# Patient Record
Sex: Male | Born: 1990 | Race: White | Hispanic: No | Marital: Married | Smoking: Current every day smoker
Health system: Southern US, Community
[De-identification: ages and names within clinical notes are randomized; demographics above are authoritative.]

## PROBLEM LIST (undated history)

## (undated) DIAGNOSIS — J45909 Unspecified asthma, uncomplicated: Secondary | ICD-10-CM

## (undated) DIAGNOSIS — F419 Anxiety disorder, unspecified: Secondary | ICD-10-CM

## (undated) DIAGNOSIS — F32A Depression, unspecified: Secondary | ICD-10-CM

## (undated) HISTORY — DX: Depression, unspecified: F32.A

## (undated) HISTORY — DX: Anxiety disorder, unspecified: F41.9

---
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
## 2011-09-15 ENCOUNTER — Emergency Department: Payer: Self-pay | Admitting: Emergency Medicine

## 2012-02-03 ENCOUNTER — Emergency Department: Payer: Self-pay | Admitting: Unknown Physician Specialty

## 2012-02-03 LAB — CBC
HCT: 40.4 % (ref 40.0–52.0)
HGB: 13.8 g/dL (ref 13.0–18.0)
MCH: 32.2 pg (ref 26.0–34.0)
MCHC: 34.2 g/dL (ref 32.0–36.0)
RBC: 4.29 10*6/uL — ABNORMAL LOW (ref 4.40–5.90)
WBC: 9.5 10*3/uL (ref 3.8–10.6)

## 2012-02-03 LAB — COMPREHENSIVE METABOLIC PANEL
Albumin: 4.6 g/dL (ref 3.4–5.0)
Alkaline Phosphatase: 75 U/L (ref 50–136)
Anion Gap: 7 (ref 7–16)
Bilirubin,Total: 0.3 mg/dL (ref 0.2–1.0)
Calcium, Total: 9.6 mg/dL (ref 8.5–10.1)
Co2: 29 mmol/L (ref 21–32)
Creatinine: 1.13 mg/dL (ref 0.60–1.30)
EGFR (African American): >60
EGFR (Non-African Amer.): >60
Glucose: 98 mg/dL (ref 65–99)
Osmolality: 278 (ref 275–301)
Sodium: 140 mmol/L (ref 136–145)
Total Protein: 8 g/dL (ref 6.4–8.2)

## 2012-02-03 LAB — URINALYSIS, COMPLETE
Bilirubin,UR: NEGATIVE
Blood: NEGATIVE
Glucose,UR: NEGATIVE mg/dL (ref 0–75)
Ketone: NEGATIVE
Ph: 6 (ref 4.5–8.0)
RBC,UR: <1 /HPF (ref 0–5)
WBC UR: NONE SEEN /HPF (ref 0–5)

## 2012-02-03 LAB — LIPASE, BLOOD: Lipase: 147 U/L (ref 73–393)

## 2012-03-27 LAB — DRUG SCREEN, URINE
Barbiturates, Ur Screen: NEGATIVE (ref ?–200)
Benzodiazepine, Ur Scrn: NEGATIVE (ref ?–200)
Cocaine Metabolite,Ur ~~LOC~~: POSITIVE (ref ?–300)
Methadone, Ur Screen: NEGATIVE (ref ?–300)
Opiate, Ur Screen: NEGATIVE (ref ?–300)
Phencyclidine (PCP) Ur S: NEGATIVE (ref ?–25)
Tricyclic, Ur Screen: NEGATIVE (ref ?–1000)

## 2012-03-27 LAB — COMPREHENSIVE METABOLIC PANEL
Alkaline Phosphatase: 91 U/L (ref 50–136)
Bilirubin,Total: 0.5 mg/dL (ref 0.2–1.0)
Calcium, Total: 9.5 mg/dL (ref 8.5–10.1)
Chloride: 103 mmol/L (ref 98–107)
Co2: 24 mmol/L (ref 21–32)
EGFR (African American): >60
EGFR (Non-African Amer.): 60 — ABNORMAL LOW
Osmolality: 278 (ref 275–301)
SGPT (ALT): 23 U/L
Sodium: 138 mmol/L (ref 136–145)

## 2012-03-27 LAB — CBC
HCT: 42.5 % (ref 40.0–52.0)
HGB: 14.2 g/dL (ref 13.0–18.0)
Platelet: 253 10*3/uL (ref 150–440)
RDW: 13.3 % (ref 11.5–14.5)
WBC: 12.9 10*3/uL — ABNORMAL HIGH (ref 3.8–10.6)

## 2012-03-27 LAB — URINALYSIS, COMPLETE
Bacteria: NONE SEEN
Bilirubin,UR: NEGATIVE
Protein: NEGATIVE
RBC,UR: <1 /HPF (ref 0–5)
Squamous Epithelial: <1
WBC UR: <1 /HPF (ref 0–5)

## 2012-03-27 LAB — ETHANOL: Ethanol: <3 mg/dL

## 2012-03-27 LAB — TSH: Thyroid Stimulating Horm: 0.68 u[IU]/mL

## 2012-03-28 ENCOUNTER — Inpatient Hospital Stay: Payer: Self-pay | Admitting: Psychiatry

## 2012-06-23 ENCOUNTER — Emergency Department: Payer: Self-pay | Admitting: Unknown Physician Specialty

## 2012-06-23 LAB — BASIC METABOLIC PANEL
Anion Gap: 10 (ref 7–16)
BUN: 18 mg/dL (ref 7–18)
Calcium, Total: 9.2 mg/dL (ref 8.5–10.1)
Co2: 24 mmol/L (ref 21–32)
EGFR (African American): >60
EGFR (Non-African Amer.): >60
Glucose: 85 mg/dL (ref 65–99)
Osmolality: 279 (ref 275–301)
Potassium: 3.7 mmol/L (ref 3.5–5.1)

## 2012-06-23 LAB — CBC WITH DIFFERENTIAL/PLATELET
Basophil #: 0.1 10*3/uL (ref 0.0–0.1)
Basophil %: 0.9 %
Eosinophil #: 0.1 10*3/uL (ref 0.0–0.7)
Eosinophil %: 1 %
HGB: 14 g/dL (ref 13.0–18.0)
Lymphocyte %: 11.8 %
MCH: 31.8 pg (ref 26.0–34.0)
Monocyte %: 12.6 %
Neutrophil %: 73.7 %
RBC: 4.41 10*6/uL (ref 4.40–5.90)
WBC: 13.5 10*3/uL — ABNORMAL HIGH (ref 3.8–10.6)

## 2013-06-05 ENCOUNTER — Emergency Department: Payer: Self-pay | Admitting: Internal Medicine

## 2014-02-16 ENCOUNTER — Emergency Department: Payer: Self-pay | Admitting: Emergency Medicine

## 2015-01-12 NOTE — H&P (Signed)
PATIENT NAME:  Joshua Owen, Joshua Owen MR#:  [id number] DATE OF BIRTH:  Apr 30, 1991  DATE OF ADMISSION:  03/28/2012  REFERRING PHYSICIAN: Daryel November, MD   ATTENDING PHYSICIAN: Kristine Linea, MD   IDENTIFYING DATA: Joshua Owen is a 24 year old male with history of depression.   CHIEF COMPLAINT: " I feel better now. "   HISTORY OF PRESENT ILLNESS: Joshua Owen lost his brother in a motorcycle accident three years ago. He became depressed and was briefly admitted to [facility]. He was started on Prozac. He did not like the way it made him feel.  He continued medication for a month but felt no improvement. He recovered and was doing okay until recently when he started experiencing some difficulties in his relationship with his baby's mama who is [redacted] weeks pregnant. This got even worse when the patient has learned that his best friend died of kidney failure three days ago. He started arguing with the girlfriend and she left him. He is suspicious that she is seeing someone else, but since he was admitted to the hospital he had an opportunity to talk to her and he feels that they will get back together. He felt okay on the day of admission while at work but when he came home he started worrying about his situation and decided to come to the hospital. He felt mildly suicidal without intention or a plan. He complains of extremely poor sleep for the past eight months, slightly decreased appetite, anhedonia, and irritability. He denies feeling of guilt, hopelessness, worthlessness, no crying, and no decreased energy, memory or concentration. He denies symptoms suggestive of bipolar mania. There are no psychotic symptoms. He denies alcohol, illicit drugs, or prescription pill abuse.   PAST PSYCHIATRIC HISTORY: As above one prior hospitalization following his brother's death. He does not have a psychiatrist at the present and is not in therapy.   FAMILY PSYCHIATRIC HISTORY: Mother with depression and  anxiety, on Xanax and Klonopin. A brother with bipolar who has not been taking his Seroquel.  PAST MEDICAL HISTORY: None.   ALLERGIES: No known drug allergies.   ADMISSION MEDICATIONS: None.   SOCIAL HISTORY: He is employed by a [employer]. 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 He is getting ready to have a baby. This is his first child. He is determined to repair his relationship with the girlfriend. She has depression herself and during pregnancy has not been taking her medications which affects their relationship.   REVIEW OF SYSTEMS: CONSTITUTIONAL: No fevers or chills. There are some weight changes. The patient can gain a few pounds and then lose them. He believes he has high metabolism. EYES: No double or blurred vision. ENT: No hearing loss. RESPIRATORY: No shortness of breath or cough. CARDIOVASCULAR: No chest pain or orthopnea. GASTROINTESTINAL: No abdominal pain, nausea, vomiting, or diarrhea. GU: No incontinence or frequency. ENDOCRINE: No heat or cold intolerance. LYMPHATIC: No anemia or easy bruising. INTEGUMENTARY: No acne or rash. MUSCULOSKELETAL: No muscle or joint pain. NEUROLOGIC: No tingling or weakness. PSYCHIATRIC: See history of present illness for details.   PHYSICAL EXAMINATION:   VITAL SIGNS: Blood pressure 109/72, pulse 55, respirations 20, and temperature 97.6.   GENERAL: This is a slender male in no acute distress.   HEENT: The pupils are equal, round, and reactive to light. Sclerae anicteric.   NECK: Supple. No thyromegaly.   LUNGS: Clear to auscultation. No dullness to percussion.   HEART: Regular rhythm and rate. No murmurs,  rubs, or gallops.   ABDOMEN: Soft, nontender, and nondistended. Positive bowel sounds.   MUSCULOSKELETAL: Normal muscle strength in all extremities.   SKIN: No rashes or bruises.   LYMPHATIC: No cervical adenopathy.   NEUROLOGIC: Cranial nerves II through XII are intact. Normal gait.   LABORATORY DATA: Chemistries  are within normal limits. Creatinine 1.62. Blood alcohol level is zero. LFTs are within normal limits. TSH 0.68. Urine drug screen is positive for cocaine and cannabinoids. CBC is within normal limits. White blood count 12.9.   Urinalysis is not suggestive of urinary tract infection.   MENTAL STATUS EXAMINATION ON ADMISSION: The patient is alert and oriented to person, place, time, and situation. He is pleasant, polite, and cooperative. He is wearing a baseball hat and hospital scrubs. He maintains good eye contact. His speech is of normal rhythm, rate, and volume. Mood is okay with full affect. Thought processing is logical and goal oriented. Thought content - he denies suicidal or homicidal ideation. There are no delusions or paranoia. There are no auditory or visual hallucinations. His cognition is grossly intact. He registers three out of three and recalls three out of three objects after five minutes. He can spell world forward and backward. He knows the current president. His insight and judgment are fair.   SUICIDE RISK ASSESSMENT ON ADMISSION: [name] with a history of depression who has not been treated who became depressed and passively suicidal in the context of major loss and relationship problems.   DIAGNOSES:  AXIS I:  1. Major depressive disorder, recurrent, moderate. 2. Cocaine abuse.  3. Marijuana abuse.   AXIS II: Deferred.   AXIS III: None.   AXIS IV: Mental illness, major loss, substance abuse, relationship, and access to care.   AXIS V: GAF on admission 40.   PLAN: The patient was admitted to [facility] unit for safety, stabilization, and medication management. He was initially placed on suicide precautions and was closely monitored for any unsafe behaviors. He underwent full psychiatric and risk assessment. He received pharmacotherapy, individual and group psychotherapy, substance abuse counseling, and support from  therapeutic milieu.  1. Suicidal ideation: This has resolved. The patient is overwhelmed with support outpouring from the family. He spoke with his girlfriend and is excited about having a baby. He states that he would never hurt himself. 2. Mood: He has been tried on Prozac in the past. He has no health insurance. We will start Paxil 20 mg daily. 3. Insomnia  of eight months duration: We will try trazodone 100 mg at night.  4. Substance abuse: The patient minimizes and does not admit to cocaine at all, but admits to smoking weed pretty regularly. He is not interested in substance abuse treatment. We will refer him to [facility] where they do have substance abuse counselors in hopes that the patient will take advantage of it.  5. Disposition: He will be discharged to home. He will follow up with [facility]. ____________________________ Ellin GoodieJolanta B. Jennet MaduroPucilowska, MD jbp:slb D: 03/28/2012 14:32:01 ET T: 03/28/2012 15:18:54 ET JOB#: [id number]  cc: Butch Otterson B. Jennet MaduroPucilowska, MD, <Dictator> Shari ProwsJOLANTA B Kaio Kuhlman MD ELECTRONICALLY SIGNED 04/07/2012 6:06

## 2015-07-22 ENCOUNTER — Emergency Department
Admission: EM | Admit: 2015-07-22 | Discharge: 2015-07-22 | Disposition: A | Discharge status: Home / Self Care | Payer: Self-pay | Attending: Emergency Medicine | Admitting: Emergency Medicine

## 2015-07-22 DIAGNOSIS — Z720 Tobacco use: Secondary | ICD-10-CM | POA: Insufficient documentation

## 2015-07-22 DIAGNOSIS — A09 Infectious gastroenteritis and colitis, unspecified: Secondary | ICD-10-CM | POA: Insufficient documentation

## 2015-07-22 HISTORY — DX: Unspecified asthma, uncomplicated: J45.909

## 2015-07-22 LAB — CBC WITH DIFFERENTIAL/PLATELET
Basophils Absolute: 0.1 10*3/uL (ref 0–0.1)
Basophils Relative: 1 %
EOS PCT: 0 %
Eosinophils Absolute: 0 10*3/uL (ref 0–0.7)
HCT: 42.1 % (ref 40.0–52.0)
Hemoglobin: 14.8 g/dL (ref 13.0–18.0)
LYMPHS ABS: 2 10*3/uL (ref 1.0–3.6)
Lymphocytes Relative: 20 %
MCH: 31.5 pg (ref 26.0–34.0)
MCHC: 35.1 g/dL (ref 32.0–36.0)
MCV: 89.9 fL (ref 80.0–100.0)
MONOS PCT: 10 %
Monocytes Absolute: 1 10*3/uL (ref 0.2–1.0)
NEUTROS PCT: 69 %
Neutro Abs: 7 10*3/uL — ABNORMAL HIGH (ref 1.4–6.5)
Platelets: 234 10*3/uL (ref 150–440)
RBC: 4.68 MIL/uL (ref 4.40–5.90)
RDW: 12.8 % (ref 11.5–14.5)
WBC: 10.1 10*3/uL (ref 3.8–10.6)

## 2015-07-22 LAB — BASIC METABOLIC PANEL
Anion gap: 6 (ref 5–15)
BUN: 11 mg/dL (ref 6–20)
CHLORIDE: 104 mmol/L (ref 101–111)
CO2: 30 mmol/L (ref 22–32)
Calcium: 10.2 mg/dL (ref 8.9–10.3)
Creatinine, Ser: 1.26 mg/dL — ABNORMAL HIGH (ref 0.61–1.24)
GFR calc Af Amer: >60 mL/min (ref >60)
GFR calc non Af Amer: >60 mL/min (ref >60)
Glucose, Bld: 105 mg/dL — ABNORMAL HIGH (ref 65–99)
Potassium: 4.3 mmol/L (ref 3.5–5.1)
SODIUM: 140 mmol/L (ref 135–145)

## 2015-07-22 LAB — URINALYSIS COMPLETE WITH MICROSCOPIC (ARMC ONLY)
Bacteria, UA: NONE SEEN
Bilirubin Urine: NEGATIVE
Glucose, UA: NEGATIVE mg/dL
Ketones, ur: NEGATIVE mg/dL
Leukocytes, UA: NEGATIVE
Nitrite: NEGATIVE
PH: 6 (ref 5.0–8.0)
PROTEIN: NEGATIVE mg/dL
Specific Gravity, Urine: 1.011 (ref 1.005–1.030)
Squamous Epithelial / LPF: NONE SEEN
WBC UA: NONE SEEN WBC/hpf (ref 0–5)

## 2015-07-22 MED ORDER — DICYCLOMINE HCL 10 MG PO CAPS
10.0000 mg | ORAL_CAPSULE | Freq: Once | ORAL | Status: AC
  Administered 2015-07-22: 10 mg via ORAL
  Filled 2015-07-22: qty 1

## 2015-07-22 MED ORDER — ONDANSETRON HCL 4 MG PO TABS: 4.0000 mg | ORAL_TABLET | Freq: Four times a day (QID) | ORAL | Status: DC | PRN

## 2015-07-22 MED ORDER — ONDANSETRON 4 MG PO TBDP
4.0000 mg | ORAL_TABLET | Freq: Once | ORAL | Status: AC
  Administered 2015-07-22: 4 mg via ORAL
  Filled 2015-07-22: qty 1

## 2015-07-22 MED ORDER — DICYCLOMINE HCL 20 MG PO TABS: 20.0000 mg | ORAL_TABLET | Freq: Three times a day (TID) | ORAL | Status: DC | PRN

## 2015-07-22 NOTE — ED Notes (Signed)
Pt reports chills, nausea, diarrhea, weakness, decreased appetite that began last night.

## 2015-07-22 NOTE — ED Notes (Signed)
Pt alert and oriented X4, active, cooperative, pt in NAD. RR even and unlabored, color WNL.  Pt informed to return if any life threatening symptoms occur.   

## 2015-07-22 NOTE — ED Notes (Signed)
Blood drawn from right AC using butterfly. Patient tolerated well. Bandaid applied. Patient medicated for nausea. Resting quietly on stretcher, call bell at bedside.

## 2015-07-22 NOTE — Discharge Instructions (Signed)
Food Choices to Help Relieve Diarrhea, Adult °When you have diarrhea, the foods you eat and your eating habits are very important. Choosing the right foods and drinks can help relieve diarrhea. Also, because diarrhea can last up to 7 days, you need to replace lost fluids and electrolytes (such as sodium, potassium, and chloride) in order to help prevent dehydration.  °WHAT GENERAL GUIDELINES DO I NEED TO FOLLOW? °· Slowly drink 1 cup (8 oz) of fluid for each episode of diarrhea. If you are getting enough fluid, your urine will be clear or pale yellow. °· Eat starchy foods. Some good choices include white rice, white toast, pasta, low-fiber cereal, baked potatoes (without the skin), saltine crackers, and bagels. °· Avoid large servings of any cooked vegetables. °· Limit fruit to two servings per day. A serving is ½ cup or 1 small piece. °· Choose foods with less than 2 g of fiber per serving. °· Limit fats to less than 8 tsp (38 g) per day. °· Avoid fried foods. °· Eat foods that have probiotics in them. Probiotics can be found in certain dairy products. °· Avoid foods and beverages that may increase the speed at which food moves through the stomach and intestines (gastrointestinal tract). Things to avoid include: °¨ High-fiber foods, such as dried fruit, raw fruits and vegetables, nuts, seeds, and whole grain foods. °¨ Spicy foods and high-fat foods. °¨ Foods and beverages sweetened with high-fructose corn syrup, honey, or sugar alcohols such as xylitol, sorbitol, and mannitol. °WHAT FOODS ARE RECOMMENDED? °Grains °White rice. White, French, or pita breads (fresh or toasted), including plain rolls, buns, or bagels. White pasta. Saltine, soda, or graham crackers. Pretzels. Low-fiber cereal. Cooked cereals made with water (such as cornmeal, farina, or cream cereals). Plain muffins. Matzo. Melba toast. Zwieback.  °Vegetables °Potatoes (without the skin). Strained tomato and vegetable juices. Most well-cooked and canned  vegetables without seeds. Tender lettuce. °Fruits °Cooked or canned applesauce, apricots, cherries, fruit cocktail, grapefruit, peaches, pears, or plums. Fresh bananas, apples without skin, cherries, grapes, cantaloupe, grapefruit, peaches, oranges, or plums.  °Meat and Other Protein Products °Baked or boiled chicken. Eggs. Tofu. Fish. Seafood. Smooth peanut butter. Ground or well-cooked tender beef, ham, veal, lamb, pork, or poultry.  °Dairy °Plain yogurt, kefir, and unsweetened liquid yogurt. Lactose-free milk, buttermilk, or soy milk. Plain hard cheese. °Beverages °Sport drinks. Clear broths. Diluted fruit juices (except prune). Regular, caffeine-free sodas such as ginger ale. Water. Decaffeinated teas. Oral rehydration solutions. Sugar-free beverages not sweetened with sugar alcohols. °Other °Bouillon, broth, or soups made from recommended foods.  °The items listed above may not be a complete list of recommended foods or beverages. Contact your dietitian for more options. °WHAT FOODS ARE NOT RECOMMENDED? °Grains °Whole grain, whole wheat, bran, or rye breads, rolls, pastas, crackers, and cereals. Wild or brown rice. Cereals that contain more than 2 g of fiber per serving. Corn tortillas or taco shells. Cooked or dry oatmeal. Granola. Popcorn. °Vegetables °Raw vegetables. Cabbage, broccoli, Brussels sprouts, artichokes, baked beans, beet greens, corn, kale, legumes, peas, sweet potatoes, and yams. Potato skins. Cooked spinach and cabbage. °Fruits °Dried fruit, including raisins and dates. Raw fruits. Stewed or dried prunes. Fresh apples with skin, apricots, mangoes, pears, raspberries, and strawberries.  °Meat and Other Protein Products °Chunky peanut butter. Nuts and seeds. Beans and lentils. Bacon.  °Dairy °High-fat cheeses. Milk, chocolate milk, and beverages made with milk, such as milk shakes. Cream. Ice cream. °Sweets and Desserts °Sweet rolls, doughnuts, and sweet breads.   Pancakes and waffles. Fats and  Oils Butter. Cream sauces. Margarine. Salad oils. Plain salad dressings. Olives. Avocados.  Beverages Caffeinated beverages (such as coffee, tea, soda, or energy drinks). Alcoholic beverages. Fruit juices with pulp. Prune juice. Soft drinks sweetened with high-fructose corn syrup or sugar alcohols. Other Coconut. Hot sauce. Chili powder. Mayonnaise. Gravy. Cream-based or milk-based soups.  The items listed above may not be a complete list of foods and beverages to avoid. Contact your dietitian for more information. WHAT SHOULD I DO IF I BECOME DEHYDRATED? Diarrhea can sometimes lead to dehydration. Signs of dehydration include dark urine and dry mouth and skin. If you think you are dehydrated, you should rehydrate with an oral rehydration solution. These solutions can be purchased at pharmacies, retail stores, or online.  Drink -1 cup (120-240 mL) of oral rehydration solution each time you have an episode of diarrhea. If drinking this amount makes your diarrhea worse, try drinking smaller amounts more often. For example, drink 1-3 tsp (5-15 mL) every 5-10 minutes.  A general rule for staying hydrated is to drink 1-2 L of fluid per day. Talk to your health care provider about the specific amount you should be drinking each day. Drink enough fluids to keep your urine clear or pale yellow.   This information is not intended to replace advice given to you by your health care provider. Make sure you discuss any questions you have with your health care provider.   Document Released: 11/27/2003 Document Revised: 09/27/2014 Document Reviewed: 07/30/2013 Elsevier Interactive Patient Education 2016 Elsevier Inc.  Diarrhea Diarrhea is watery poop (stool). It can make you feel weak, tired, thirsty, or give you a dry mouth (signs of dehydration). Watery poop is a sign of another problem, most often an infection. It often lasts 2-3 days. It can last longer if it is a sign of something serious. Take care of  yourself as told by your doctor. HOME CARE   Drink 1 cup (8 ounces) of fluid each time you have watery poop.  Do not drink the following fluids:  Those that contain simple sugars (fructose, glucose, galactose, lactose, sucrose, maltose).  Sports drinks.  Fruit juices.  Whole milk products.  Sodas.  Drinks with caffeine (coffee, tea, soda) or alcohol.  Oral rehydration solution may be used if the doctor says it is okay. You may make your own solution. Follow this recipe:   - teaspoon table salt.   teaspoon baking soda.   teaspoon salt substitute containing potassium chloride.  1 tablespoons sugar.  1 liter (34 ounces) of water.  Avoid the following foods:  High fiber foods, such as raw fruits and vegetables.  Nuts, seeds, and whole grain breads and cereals.   Those that are sweetened with sugar alcohols (xylitol, sorbitol, mannitol).  Try eating the following foods:  Starchy foods, such as rice, toast, pasta, low-sugar cereal, oatmeal, baked potatoes, crackers, and bagels.  Bananas.  Applesauce.  Eat probiotic-rich foods, such as yogurt and milk products that are fermented.  Wash your hands well after each time you have watery poop.  Only take medicine as told by your doctor.  Take a warm bath to help lessen burning or pain from having watery poop. GET HELP RIGHT AWAY IF:   You cannot drink fluids without throwing up (vomiting).  You keep throwing up.  You have blood in your poop, or your poop looks black and tarry.  You do not pee (urinate) in 6-8 hours, or there is only a small  amount of very dark pee.  You have belly (abdominal) pain that gets worse or stays in the same spot (localizes).  You are weak, dizzy, confused, or light-headed.  You have a very bad headache.  Your watery poop gets worse or does not get better.  You have a fever or lasting symptoms for more than 2-3 days.  You have a fever and your symptoms suddenly get worse. MAKE  SURE YOU:   Understand these instructions.  Will watch your condition.  Will get help right away if you are not doing well or get worse.   This information is not intended to replace advice given to you by your health care provider. Make sure you discuss any questions you have with your health care provider.   Document Released: 02/23/2008 Document Revised: 09/27/2014 Document Reviewed: 05/14/2012 Elsevier Interactive Patient Education 2016 [employer]   Take the prescription meds as directed.  Increase fluid intake to prevent dehydration.  Be sure to wash hands with soap & water to reduce the spread of the virus.

## 2015-07-22 NOTE — ED Provider Notes (Signed)
[facility] Emergency Department Provider Note ____________________________________________  Time seen: 1515  I have reviewed the triage vital signs and the nursing notes.  HISTORY  Chief Complaint  Chills; Emesis; and Weakness  HPI Joshua Owen is a 24 y.o. male or to the ED for evaluation of an onset of nausea, diarrhea, and abdominal pain last night. He also is aware of severe weakness and poor appetite, although he has been able to keep fluids down.He describes leaving work early due to his symptoms, last night. He also notes similar symptoms in his mother. He denies any other sick contacts, bad food, or recent travel. He has been without fevers, sweats, or vomiting in the last 24 hours. He endorses poor appetite, crampy abdominal pain, and weakness.   Past Medical History  Diagnosis Date  . Asthma     There are no active problems to display for this patient.   History reviewed. No pertinent past surgical history.  Current Outpatient Rx  Name  Route  Sig  Dispense  Refill  . dicyclomine (BENTYL) 20 MG tablet   Oral   Take 1 tablet (20 mg total) by mouth 3 (three) times daily as needed for spasms.   15 tablet   0   . ondansetron (ZOFRAN) 4 MG tablet   Oral   Take 1 tablet (4 mg total) by mouth every 6 (six) hours as needed for nausea or vomiting.   15 tablet   0    Allergies Review of patient's allergies indicates no known allergies.  No family history on file.  Social History Social History  Substance Use Topics  . Smoking status: Current Every Day Smoker  . Smokeless tobacco: None  . Alcohol Use: No   Review of Systems  Constitutional: Negative for fever. Eyes: Negative for visual changes. ENT: Negative for sore throat. Cardiovascular: Negative for chest pain. Respiratory: Negative for shortness of breath. Gastrointestinal: Positive for abdominal pain, vomiting and diarrhea. Genitourinary: Negative for  dysuria. Musculoskeletal: Negative for back pain. Skin: Negative for rash. Neurological: Negative for headaches, focal weakness or numbness. ____________________________________________  PHYSICAL EXAM:  VITAL SIGNS: ED Triage Vitals  Enc Vitals Group     BP 07/22/15 1500 120/79 mmHg     Pulse Rate 07/22/15 1500 83     Resp 07/22/15 1500 18     Temp 07/22/15 1500 98.2 F (36.8 C)     Temp Source 07/22/15 1500 Oral     SpO2 07/22/15 1500 100 %     Weight 07/22/15 1500 165 lb (74.844 kg)     Height 07/22/15 1500  (1.753 m)     Head Cir --      Peak Flow --      Pain Score --      Pain Loc --      Pain Edu? --      Excl. in GC? --    Constitutional: Alert and oriented. Well appearing and in no distress. Head: Normocephalic and atraumatic.      Eyes: Conjunctivae are normal. PERRL. Normal extraocular movements      Ears: Canals clear. TMs intact bilaterally.   Nose: No congestion/rhinorrhea.   Mouth/Throat: Mucous membranes are moist.   Neck: Supple. No thyromegaly. Hematological/Lymphatic/Immunological: No cervical lymphadenopathy. Cardiovascular: Normal rate, regular rhythm.  Respiratory: Normal respiratory effort. No wheezes/rales/rhonchi. Gastrointestinal: Soft and nontender. No distention. Musculoskeletal: Nontender with normal range of motion in all extremities.  Neurologic:  Normal gait without ataxia. Normal speech and language.  No gross focal neurologic deficits are appreciated. Skin:  Skin is warm, dry and intact. No rash noted. Psychiatric: Mood and affect are normal. Patient exhibits appropriate insight and judgment. ____________________________________________   LABS (pertinent positives/negatives) Labs Reviewed  BASIC METABOLIC PANEL - Abnormal; Notable for the following:    Glucose, Bld 105 (*)    Creatinine, Ser 1.26 (*)    All other components within normal limits  CBC WITH DIFFERENTIAL/PLATELET - Abnormal; Notable for the following:     Neutro Abs 7.0 (*)    All other components within normal limits  URINALYSIS COMPLETEWITH MICROSCOPIC ([facility] ONLY) - Abnormal; Notable for the following:    Color, Urine YELLOW (*)    APPearance CLEAR (*)    Hgb urine dipstick 1+ (*)    All other components within normal limits  ____________________________________________  PROCEDURES  Zofran 4 mg ODT Bentyl 10 mg PO ____________________________________________  INITIAL IMPRESSION / ASSESSMENT AND PLAN / ED COURSE  Patient presents to the ED with symptoms consistent with a viral gastroenteritis. He was discharged home with instructions on prevention of spread of virus, foods to help reduce diarrhea, and prescriptions for antinausea medicine. He'll be provided with a work note as requested for 2-3 days as needed. He is encouraged to increase fluid intake to reduce risk of dehydration. Follow-up with his primary care provider or local community clinic as needed. ____________________________________________  FINAL CLINICAL IMPRESSION(S) / ED DIAGNOSES  Final diagnoses:  Gastroenteritis, infectious      Lissa HoardJenise V Bacon Bucky Grigg, PA-C 07/22/15 1706  Emily FilbertJonathan E Williams, MD 07/22/15 2141

## 2016-09-21 ENCOUNTER — Encounter: Payer: Self-pay | Admitting: Emergency Medicine

## 2016-09-21 ENCOUNTER — Emergency Department: Payer: Self-pay

## 2016-09-21 ENCOUNTER — Emergency Department
Admission: EM | Admit: 2016-09-21 | Discharge: 2016-09-21 | Disposition: A | Discharge status: Home / Self Care | Payer: Self-pay | Attending: Emergency Medicine | Admitting: Emergency Medicine

## 2016-09-21 DIAGNOSIS — J45909 Unspecified asthma, uncomplicated: Secondary | ICD-10-CM | POA: Insufficient documentation

## 2016-09-21 DIAGNOSIS — F17200 Nicotine dependence, unspecified, uncomplicated: Secondary | ICD-10-CM | POA: Insufficient documentation

## 2016-09-21 DIAGNOSIS — Y9389 Activity, other specified: Secondary | ICD-10-CM | POA: Insufficient documentation

## 2016-09-21 DIAGNOSIS — M25561 Pain in right knee: Secondary | ICD-10-CM

## 2016-09-21 DIAGNOSIS — M7041 Prepatellar bursitis, right knee: Secondary | ICD-10-CM | POA: Insufficient documentation

## 2016-09-21 MED ORDER — KETOROLAC TROMETHAMINE 30 MG/ML IJ SOLN
30.0000 mg | Freq: Once | INTRAMUSCULAR | Status: AC
  Administered 2016-09-21: 30 mg via INTRAMUSCULAR
  Filled 2016-09-21: qty 1

## 2016-09-21 MED ORDER — NAPROXEN 500 MG PO TABS: 500.0000 mg | ORAL_TABLET | Freq: Two times a day (BID) | ORAL | 0 refills | Status: DC

## 2016-09-21 NOTE — ED Notes (Signed)
See triage note  States he is having pain to right knee states his dog ran into his knee last pm  Having increased apin this am  Ambulates to room with limp  No deformity noted

## 2016-09-21 NOTE — ED Triage Notes (Signed)
pt to ed with c/o right knee pain that started last night after his dog hit him in the knee and he fell.  Pt reports increased pain with walking today.

## 2016-09-21 NOTE — ED Provider Notes (Signed)
[facility] Emergency Department Provider Note  ____________________________________________  Time seen: Approximately 11:01 AM  I have reviewed the triage vital signs and the nursing notes.   HISTORY  Chief Complaint Knee Pain    HPI Joshua Owen is a 26 y.o. male , NAD, presents to emergency department with one-day history of right knee pain. States one of his dogs ran into the front of his right knee yesterday. Experienced pain throughout the front and back of the knee since that time. Woke today with worsening pain and decreased ability to bear weight without severe pain. Has taken ibuprofen without any relief. Denies any redness, abnormal warmth or swelling. Denies any numbness, weakness, nor tingling in lower extremity.   Past Medical History:  Diagnosis Date  . Asthma     There are no active problems to display for this patient.   History reviewed. No pertinent surgical history.  Prior to Admission medications   Medication Sig Start Date End Date Taking? Authorizing Provider  naproxen (NAPROSYN) 500 MG tablet Take 1 tablet (500 mg total) by mouth 2 (two) times daily with a meal. 09/21/16   Treavor Blomquist L Coco Sharpnack, PA-C    Allergies Patient has no known allergies.  No family history on file.  Social History Social History  Substance Use Topics  . Smoking status: Current Every Day Smoker  . Smokeless tobacco: Not on file  . Alcohol use No     Review of Systems  Constitutional: No fever/chills Musculoskeletal: Positive right knee pain. Negative right hip, thigh, lower leg, ankle or foot pain.  Skin: Negative for rash, Redness, abnormal warmth, open wounds or lacerations. Neurological: Negative for Numbness, weakness, tingling  ____________________________________________   PHYSICAL EXAM:  VITAL SIGNS: ED Triage Vitals [09/21/16 1035]  Enc Vitals Group     BP (!) 143/71     Pulse Rate 80     Resp 20     Temp 98.2 F (36.8 C)      Temp Source Oral     SpO2 100 %     Weight 165 lb (74.8 kg)     Height      Head Circumference      Peak Flow      Pain Score 8     Pain Loc      Pain Edu?      Excl. in GC?      Constitutional: Alert and oriented. Well appearing and in no acute distress. Eyes: Conjunctivae are normal.  Head: Atraumatic. Cardiovascular: Good peripheral circulation with 2+ pulses noted in the right lower extremity. Respiratory: Normal respiratory effort without tachypnea or retractions.  Musculoskeletal: Tenderness to palpation over the right anterior knee over the patella but no bony abnormality or crepitus. Tenderness to palpation over the posterior knee without any masses or fluctuance. No laxity with anterior or posterior drawer. No laxity with varus and valgus stress. Full range of motion of the right knee but pain with full flexion. No lower extremity tenderness nor edema.  No joint effusions. Neurologic:  Normal speech and language. No gross focal neurologic deficits are appreciated. Sensation light touch grossly intact about the right lower extremity.  Skin:  Skin is warm, dry and intact. No rash, redness, abnormal warmth, swelling, open wounds or lacerations noted. Psychiatric: Mood and affect are normal. Speech and behavior are normal. Patient exhibits appropriate insight and judgement.   ____________________________________________   LABS  None ____________________________________________  EKG  None ____________________________________________  RADIOLOGY I, Keyon Winnick L Briel Gallicchio,  personally viewed and evaluated these images (plain radiographs) as part of my medical decision making, as well as reviewing the written report by the radiologist.  Dg Knee Complete 4 Views Right  Result Date: 09/21/2016 CLINICAL DATA:  Right knee pain, dog ran into his right knee yesterday EXAM: RIGHT KNEE - COMPLETE 4+ VIEW COMPARISON:  None. FINDINGS: Four views of the right knee submitted. No acute fracture or  subluxation. No joint effusion. Mild prepatellar soft tissue swelling. IMPRESSION: No acute fracture or subluxation. Mild prepatellar soft tissue swelling. Electronically Signed   By: Natasha Mead M.D.   On: 09/21/2016 11:45    ____________________________________________    PROCEDURES  Procedure(s) performed: None   Procedures   Medications  ketorolac (TORADOL) 30 MG/ML injection 30 mg (30 mg Intramuscular Given 09/21/16 1205)   ____________________________________________   INITIAL IMPRESSION / ASSESSMENT AND PLAN / ED COURSE  Pertinent labs & imaging results that were available during my care of the patient were reviewed by me and considered in my medical decision making (see chart for details).  Clinical Course     Patient's diagnosis is consistent with prepatellar bursitis of the right knee causing acute pain. Patient was placed in an Ace wrap and given crutches for supportive care. Patient was also given IM Toradol to decrease pain and tolerated well without side effects. Patient will be discharged home with prescriptions for naproxen to take as directed. Patient may apply ice or warm heat to the affected area 20 minutes 3-4 times daily as needed. Patient is to follow up with orthopedics if symptoms persist past this treatment course. Patient is given ED precautions to return to the ED for any worsening or new symptoms.    ____________________________________________  FINAL CLINICAL IMPRESSION(S) / ED DIAGNOSES  Final diagnoses:  Prepatellar bursitis of right knee  Acute pain of right knee      NEW MEDICATIONS STARTED DURING THIS VISIT:  Discharge Medication List as of 09/21/2016 12:15 PM    START taking these medications   Details  naproxen (NAPROSYN) 500 MG tablet Take 1 tablet (500 mg total) by mouth 2 (two) times daily with a meal., Starting Tue 09/21/2016, [employer], PA-C 09/21/16 1321

## 2016-09-22 DIAGNOSIS — S8001XA Contusion of right knee, initial encounter: Secondary | ICD-10-CM

## 2016-09-22 HISTORY — DX: Contusion of right knee, initial encounter: S80.01XA

## 2017-06-07 ENCOUNTER — Emergency Department: Payer: Self-pay

## 2017-06-07 ENCOUNTER — Encounter: Payer: Self-pay | Admitting: Emergency Medicine

## 2017-06-07 ENCOUNTER — Emergency Department
Admission: EM | Admit: 2017-06-07 | Discharge: 2017-06-07 | Disposition: A | Discharge status: Home / Self Care | Payer: Self-pay | Attending: Emergency Medicine | Admitting: Emergency Medicine

## 2017-06-07 DIAGNOSIS — J40 Bronchitis, not specified as acute or chronic: Secondary | ICD-10-CM

## 2017-06-07 DIAGNOSIS — Z79899 Other long term (current) drug therapy: Secondary | ICD-10-CM | POA: Insufficient documentation

## 2017-06-07 DIAGNOSIS — F17200 Nicotine dependence, unspecified, uncomplicated: Secondary | ICD-10-CM | POA: Insufficient documentation

## 2017-06-07 MED ORDER — IPRATROPIUM-ALBUTEROL 0.5-2.5 (3) MG/3ML IN SOLN
3.0000 mL | Freq: Once | RESPIRATORY_TRACT | Status: AC
  Administered 2017-06-07: 3 mL via RESPIRATORY_TRACT
  Filled 2017-06-07: qty 3

## 2017-06-07 MED ORDER — PREDNISONE 10 MG PO TABS: ORAL_TABLET | ORAL | 0 refills | Status: DC

## 2017-06-07 MED ORDER — DOXYCYCLINE HYCLATE 50 MG PO CAPS: 100.0000 mg | ORAL_CAPSULE | Freq: Two times a day (BID) | ORAL | 0 refills | Status: AC

## 2017-06-07 NOTE — ED Triage Notes (Signed)
Presents with headache congestion cough and body aches since last Wednesday  Unsure of fever but has had "hot and cold spells" denies any n/v/d

## 2017-06-07 NOTE — ED Provider Notes (Signed)
[facility] Emergency Department Provider Note  ____________________________________________  Time seen: Approximately 10:50 AM  I have reviewed the triage vital signs and the nursing notes.   HISTORY  Chief Complaint Generalized Body Aches and Headache    HPI Joshua Owen is a 26 y.o. male that presents to the emergency department for evaluation of nasal congestion, productive cough with yellow sputum, and chills for 6 days. Patient smokes half pack of cigarettes per day. He has a history of asthma but only uses his inhalers when he feels like she is going to have a panic attack. He estimates pending $50 on over-the-counter cold medication, without relief. Patient has been going to work through this illness but is now concerned for losing his job. No sick contacts. No sore throat, nausea, vomiting, abdominal pain.   Past Medical History:  Diagnosis Date  . Asthma     There are no active problems to display for this patient.   History reviewed. No pertinent surgical history.  Prior to Admission medications   Medication Sig Start Date End Date Taking? Authorizing Provider  doxycycline (VIBRAMYCIN) 50 MG capsule Take 2 capsules (100 mg total) by mouth 2 (two) times daily. 06/07/17 06/17/17  Enid Derry, PA-C  naproxen (NAPROSYN) 500 MG tablet Take 1 tablet (500 mg total) by mouth 2 (two) times daily with a meal. 09/21/16   Hagler, Jami L, PA-C  predniSONE (DELTASONE) 10 MG tablet Take 6 tablets on day 1, take 5 tablets on day 2, take 4 tablets on day 3, take 3 tablets on day 4, take 2 tablets on day 5, take 1 tablet on day 6 06/07/17   Enid Derry, PA-C    Allergies Patient has no known allergies.  No family history on file.  Social History Social History  Substance Use Topics  . Smoking status: Current Every Day Smoker  . Smokeless tobacco: Never Used  . Alcohol use No     Review of Systems  Eyes: No visual changes. No discharge. ENT:  Positive for congestion and rhinorrhea. Cardiovascular: No chest pain. Respiratory: Positive for cough.  Gastrointestinal: No abdominal pain.  No nausea, no vomiting.  No diarrhea.  No constipation. Skin: Negative for rash, abrasions, lacerations, ecchymosis. Neurological: Negative for headaches.   ____________________________________________   PHYSICAL EXAM:  VITAL SIGNS: ED Triage Vitals  Enc Vitals Group     BP 06/07/17 1030 129/66     Pulse Rate 06/07/17 1030 64     Resp 06/07/17 1030 18     Temp 06/07/17 1030 98.4 F (36.9 C)     Temp Source 06/07/17 1030 Oral     SpO2 06/07/17 1030 99 %     Weight 06/07/17 1027 165 lb (74.8 kg)     Height 06/07/17 1027  (1.753 m)     Head Circumference --      Peak Flow --      Pain Score 06/07/17 1027 0     Pain Loc --      Pain Edu? --      Excl. in GC? --      Constitutional: Alert and oriented. Well appearing and in no acute distress. Eyes: Conjunctivae are normal. PERRL. EOMI. No discharge. Head: Atraumatic. ENT: No frontal and maxillary sinus tenderness.      Ears: Tympanic membranes pearly gray with good landmarks. No discharge.      Nose: Mild congestion/rhinnorhea.      Mouth/Throat: Mucous membranes are moist. Oropharynx non-erythematous. Tonsils not  enlarged. No exudates. Uvula midline. Neck: No stridor.   Hematological/Lymphatic/Immunilogical: No cervical lymphadenopathy. Cardiovascular: Normal rate, regular rhythm.  Good peripheral circulation. Respiratory: Normal respiratory effort without tachypnea or retractions. Diffuse wheezing and rhonchi on auscultation. Good air entry to the bases with no decreased or absent breath sounds. Gastrointestinal: Bowel sounds 4 quadrants. Soft and nontender to palpation. No guarding or rigidity. No palpable masses. No distention. Musculoskeletal: Full range of motion to all extremities. No gross deformities appreciated. Neurologic:  Normal speech and language. No gross focal  neurologic deficits are appreciated.  Skin:  Skin is warm, dry and intact. No rash noted.   ____________________________________________   LABS (all labs ordered are listed, but only abnormal results are displayed)  Labs Reviewed - No data to display ____________________________________________  EKG   ____________________________________________  RADIOLOGY Lexine Baton, personally viewed and evaluated these images (plain radiographs) as part of my medical decision making, as well as reviewing the written report by the radiologist.  Dg Chest 2 View  Result Date: 06/07/2017 CLINICAL DATA:  Cough, shortness of breath, chills for a week, smoking history EXAM: CHEST  2 VIEW COMPARISON:  Chest x-ray of 02/16/2014 FINDINGS: No pneumonia or pleural effusion is seen. There is some peribronchial thickening however which may indicate bronchitis. Mediastinal and hilar contours are unremarkable. The heart is within normal limits in size. No bony abnormality is seen. IMPRESSION: No pneumonia.  Question bronchitis. Electronically Signed   By: Dwyane Dee M.D.   On: 06/07/2017 11:01    ____________________________________________    PROCEDURES  Procedure(s) performed:    Procedures    Medications  ipratropium-albuterol (DUONEB) 0.5-2.5 (3) MG/3ML nebulizer solution 3 mL (3 mLs Nebulization Given 06/07/17 1107)     ____________________________________________   INITIAL IMPRESSION / ASSESSMENT AND PLAN / ED COURSE  Pertinent labs & imaging results that were available during my care of the patient were reviewed by me and considered in my medical decision making (see chart for details).  Review of the [place] CSRS was performed in accordance of the [facility] prior to dispensing any controlled drugs.   Patient's diagnosis is consistent with bronchitis. Vital signs and exam are reassuring. Chest x-ray negative for acute cardiopulmonary processes. Work note was provided. Patient feels  comfortable going home. Patient will be discharged home with prescriptions for doxycycline, prednisone. Patient is to follow up with PCP as needed or otherwise directed. Patient is given ED precautions to return to the ED for any worsening or new symptoms.     ____________________________________________  FINAL CLINICAL IMPRESSION(S) / ED DIAGNOSES  Final diagnoses:  Bronchitis      NEW MEDICATIONS STARTED DURING THIS VISIT:  Discharge Medication List as of 06/07/2017 11:33 AM    START taking these medications   Details  doxycycline (VIBRAMYCIN) 50 MG capsule Take 2 capsules (100 mg total) by mouth 2 (two) times daily., Starting Tue 06/07/2017, Until Fri 06/17/2017, Print    predniSONE (DELTASONE) 10 MG tablet Take 6 tablets on day 1, take 5 tablets on day 2, take 4 tablets on day 3, take 3 tablets on day 4, take 2 tablets on day 5, take 1 tablet on day 6, Print            This chart was dictated using voice recognition software/Dragon. Despite best efforts to proofread, errors can occur which can change the meaning. Any change was purely unintentional.    Enid Derry, PA-C 06/07/17 1230    Alphonzo Lemmings Rudy Jew, MD 06/07/17 [phone]

## 2017-11-04 IMAGING — DX DG KNEE COMPLETE 4+V*R*
4 series · 4 of 4 positions shown · non-contrast
Comparison: None.

CLINICAL DATA: Right knee pain, dog ran into his right knee
yesterday

EXAM:
RIGHT KNEE - COMPLETE 4+ VIEW

[knee ap]
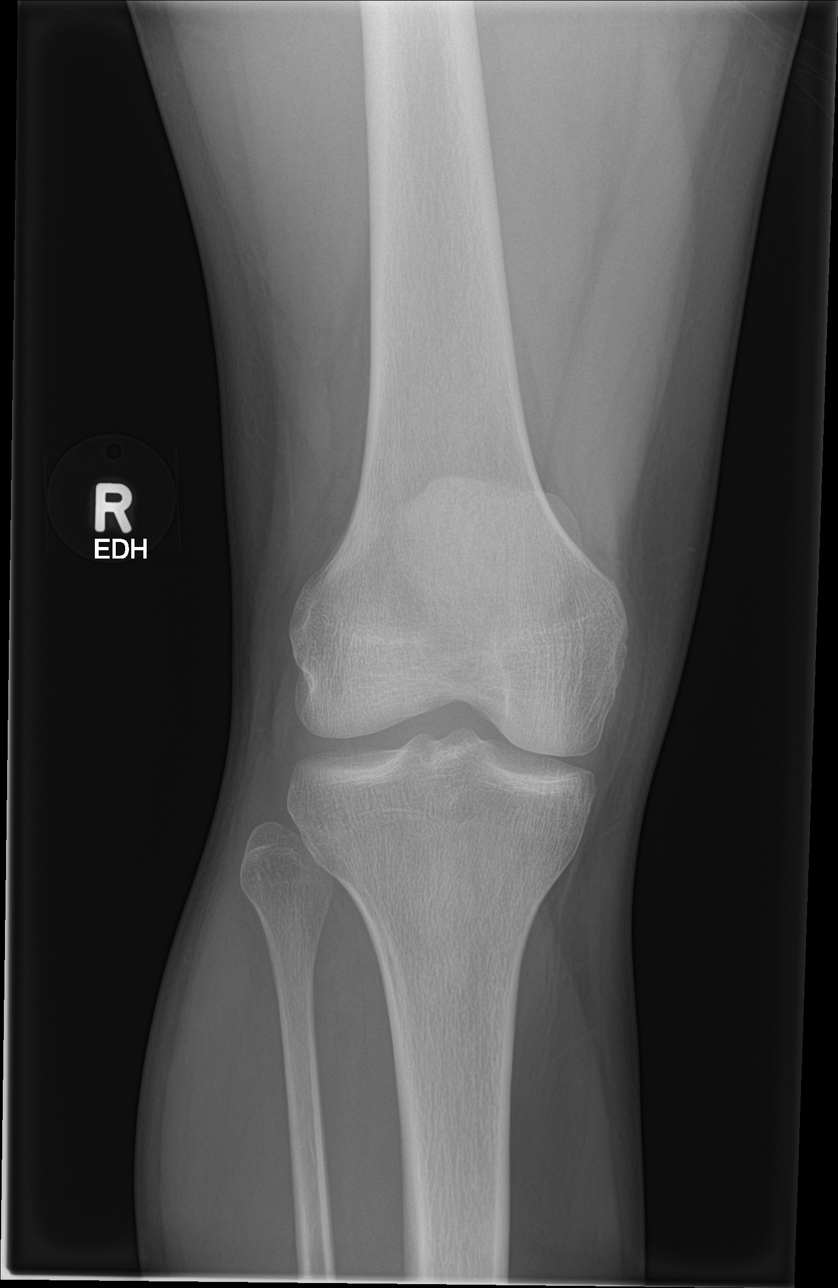

[knee obl (1 of 2)]
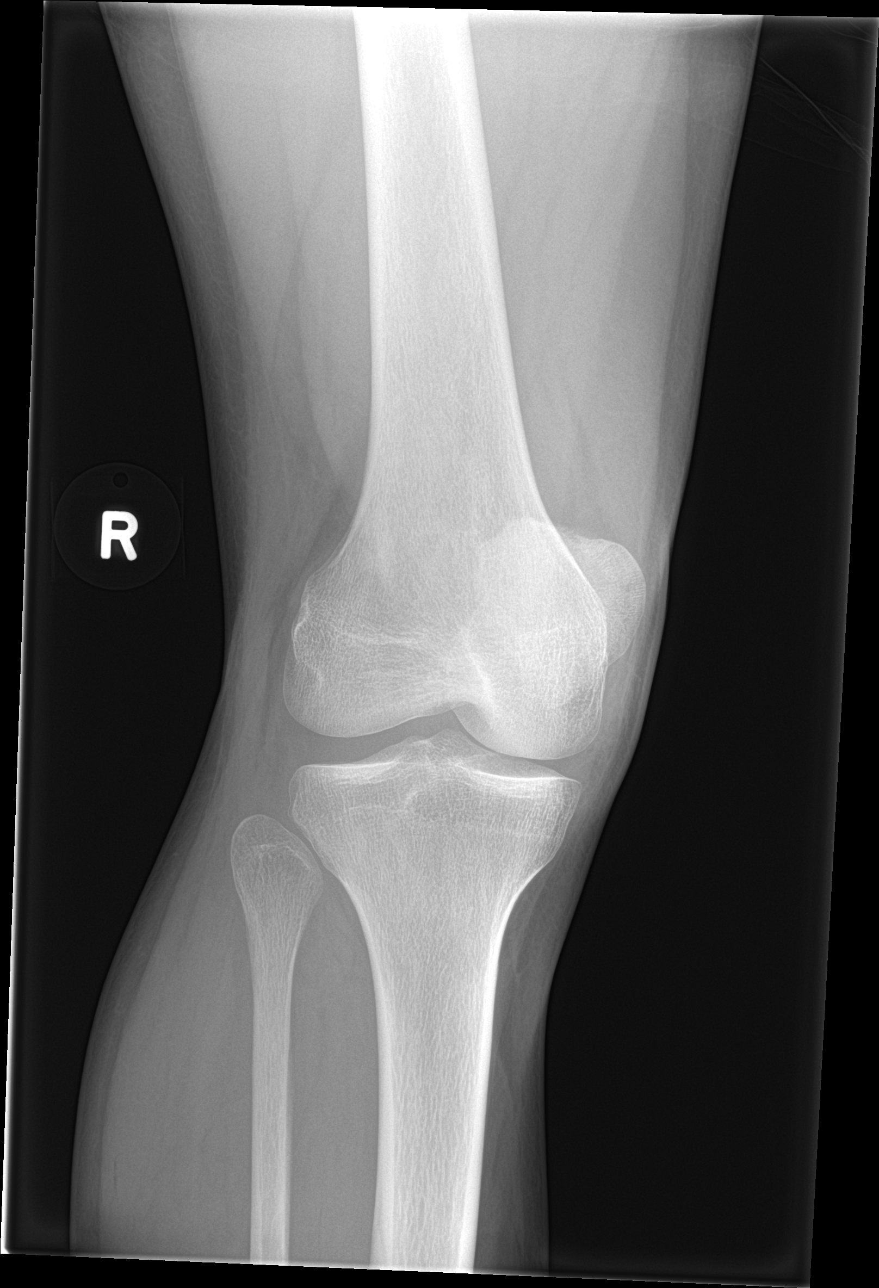

[knee obl (2 of 2)]
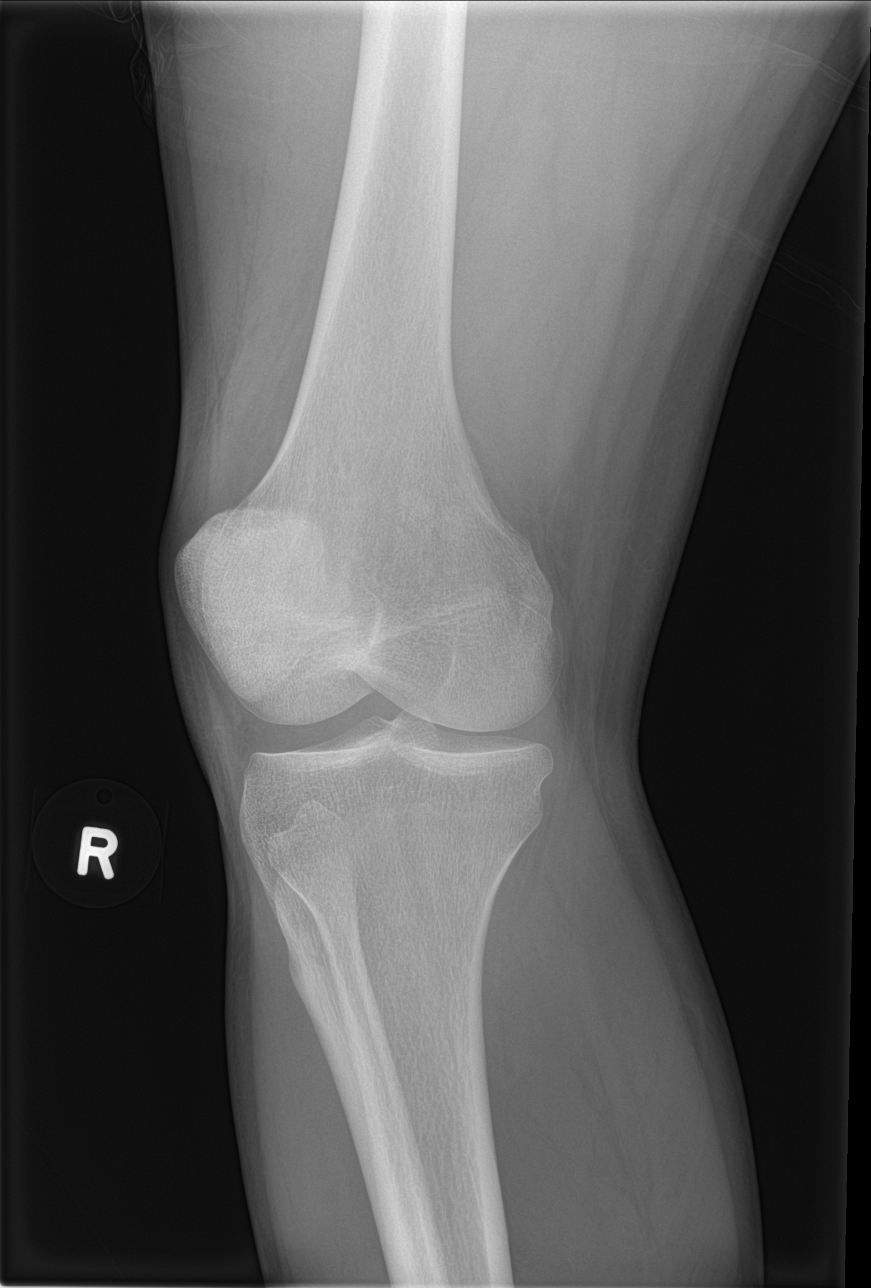

[knee lat]
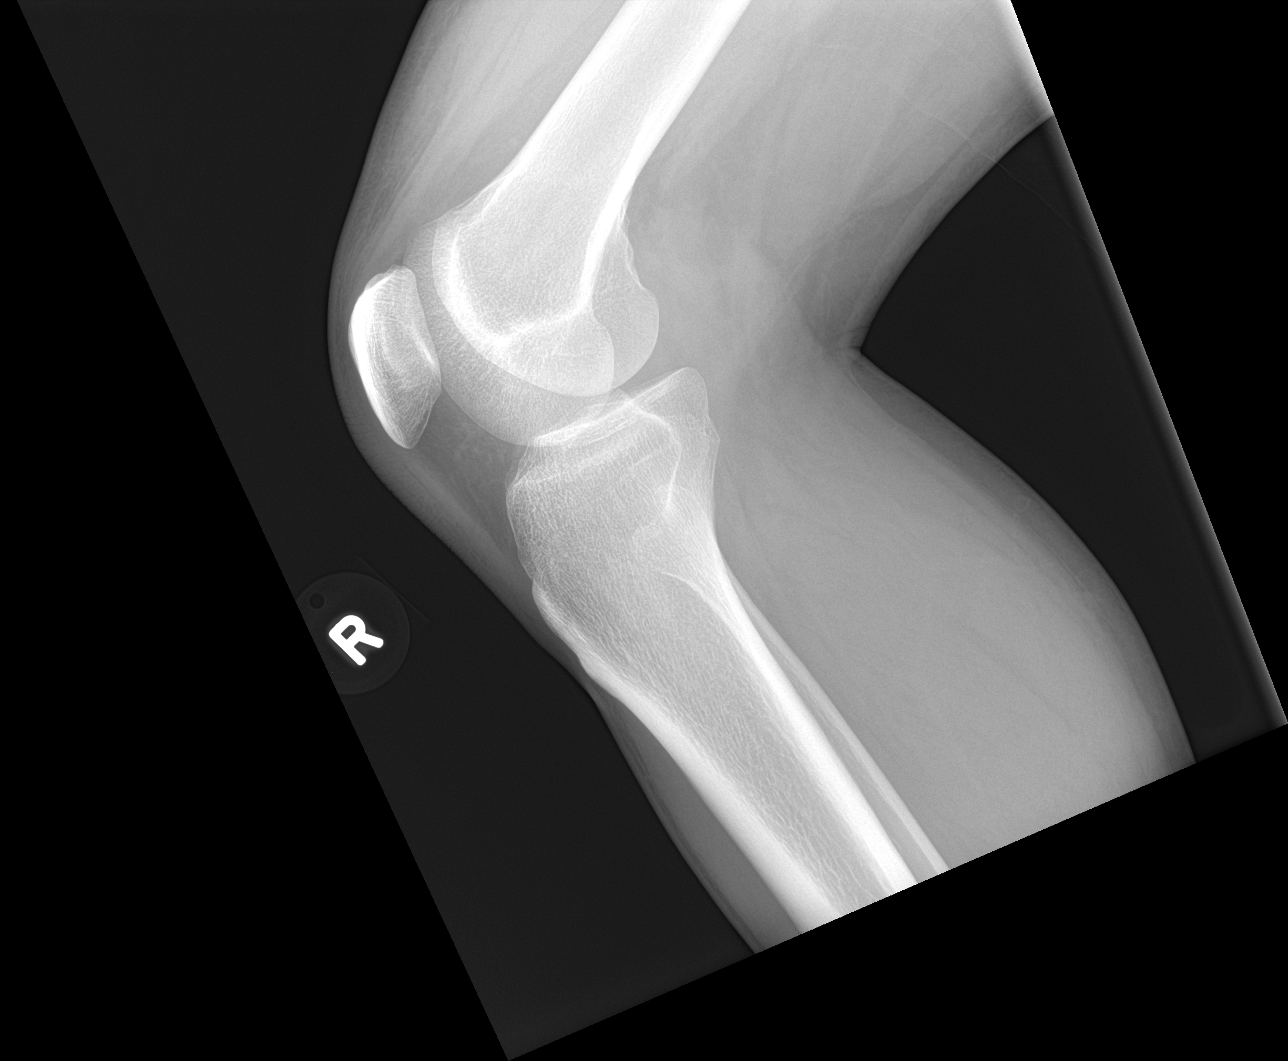

[4 of 4 positions shown; findings below may reference images not displayed]

FINDINGS: Four views of the right knee submitted. No acute fracture or
subluxation. No joint effusion. Mild prepatellar soft tissue
swelling.
IMPRESSION: No acute fracture or subluxation. Mild prepatellar soft tissue
swelling.

## 2018-07-21 IMAGING — CR DG CHEST 2V
2 series · 2 of 2 positions shown · non-contrast
Comparison: Chest x-ray of 02/16/2014

CLINICAL DATA: Cough, shortness of breath, chills for a week,
smoking history

EXAM:
CHEST  2 VIEW

[chest pa]
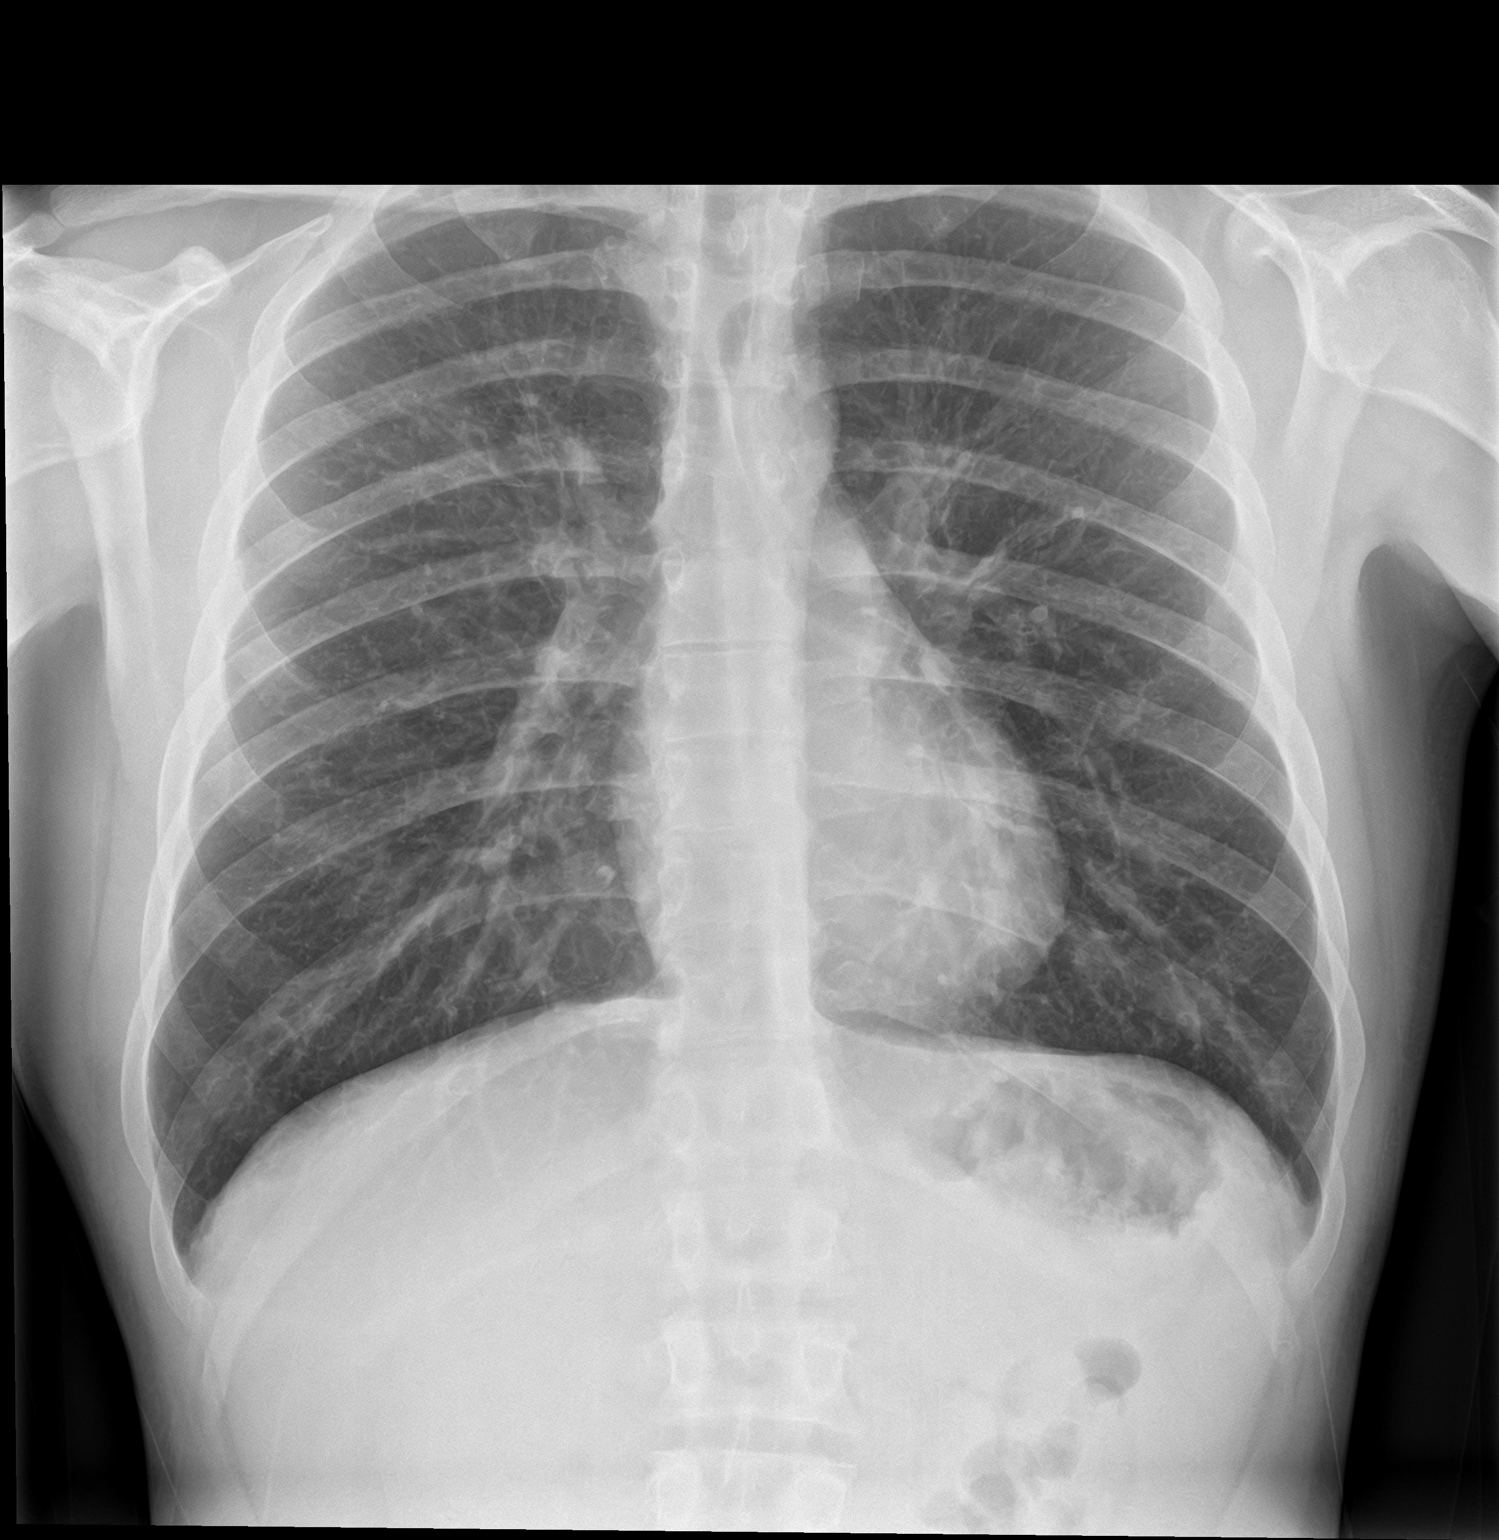

[chest lat]
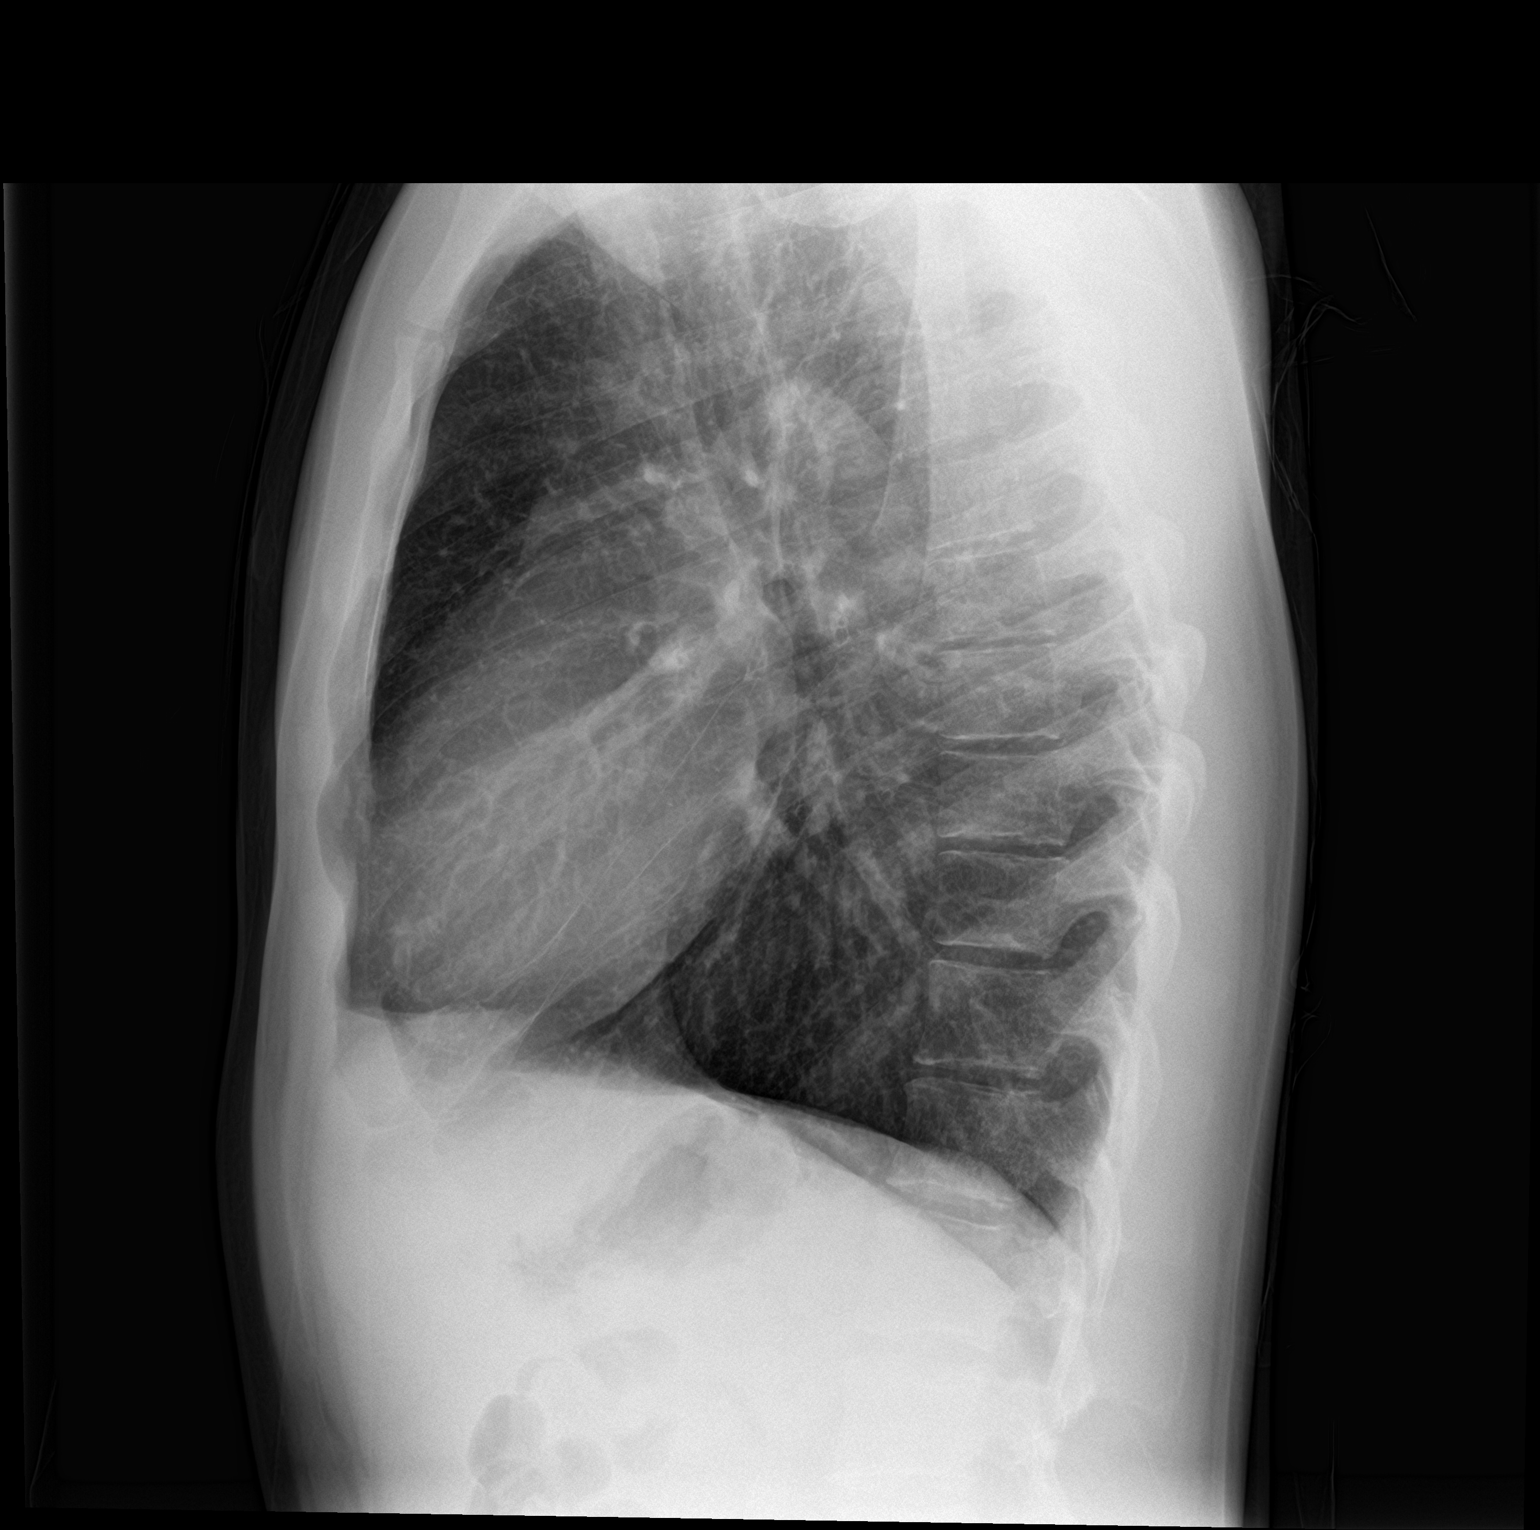

[2 of 2 positions shown; findings below may reference images not displayed]

FINDINGS: No pneumonia or pleural effusion is seen. There is some
peribronchial thickening however which may indicate bronchitis.
Mediastinal and hilar contours are unremarkable. The heart is within
normal limits in size. No bony abnormality is seen.
IMPRESSION: No pneumonia.  Question bronchitis.

## 2020-12-01 ENCOUNTER — Other Ambulatory Visit: Payer: Self-pay

## 2020-12-01 ENCOUNTER — Emergency Department
Admission: EM | Admit: 2020-12-01 | Discharge: 2020-12-01 | Disposition: A | Discharge status: Home / Self Care | Payer: Self-pay | Attending: Emergency Medicine | Admitting: Emergency Medicine

## 2020-12-01 DIAGNOSIS — F17200 Nicotine dependence, unspecified, uncomplicated: Secondary | ICD-10-CM | POA: Insufficient documentation

## 2020-12-01 DIAGNOSIS — S0592XA Unspecified injury of left eye and orbit, initial encounter: Secondary | ICD-10-CM

## 2020-12-01 DIAGNOSIS — J45909 Unspecified asthma, uncomplicated: Secondary | ICD-10-CM | POA: Insufficient documentation

## 2020-12-01 DIAGNOSIS — S0083XA Contusion of other part of head, initial encounter: Secondary | ICD-10-CM | POA: Insufficient documentation

## 2020-12-01 DIAGNOSIS — S060X0A Concussion without loss of consciousness, initial encounter: Secondary | ICD-10-CM | POA: Insufficient documentation

## 2020-12-01 DIAGNOSIS — Y92410 Unspecified street and highway as the place of occurrence of the external cause: Secondary | ICD-10-CM | POA: Insufficient documentation

## 2020-12-01 MED ORDER — IBUPROFEN 800 MG PO TABS: 800.0000 mg | ORAL_TABLET | Freq: Three times a day (TID) | ORAL | 0 refills | Status: DC | PRN

## 2020-12-01 NOTE — Discharge Instructions (Signed)
Please return to the emergency department immediately if you begin experiencing double vision, blurry vision, or worsening pain in the left eye.  Patient is clear for detainment by law enforcement with proper supervision
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
## 2023-01-05 ENCOUNTER — Emergency Department
Admission: EM | Admit: 2023-01-05 | Discharge: 2023-01-05 | Discharge status: Against Medical Advice | Payer: 59 | Attending: Emergency Medicine | Admitting: Emergency Medicine

## 2023-01-05 ENCOUNTER — Other Ambulatory Visit: Payer: Self-pay

## 2023-01-05 DIAGNOSIS — W228XXA Striking against or struck by other objects, initial encounter: Secondary | ICD-10-CM | POA: Insufficient documentation

## 2023-01-05 DIAGNOSIS — S0101XA Laceration without foreign body of scalp, initial encounter: Secondary | ICD-10-CM | POA: Diagnosis not present

## 2023-01-05 DIAGNOSIS — Y990 Civilian activity done for income or pay: Secondary | ICD-10-CM | POA: Diagnosis not present

## 2023-01-05 NOTE — ED Notes (Signed)
No answer when called several times from lobby 
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
## 2024-06-21 ENCOUNTER — Ambulatory Visit: Admitting: Physician Assistant

## 2024-06-21 ENCOUNTER — Encounter: Payer: Self-pay | Admitting: Physician Assistant

## 2024-06-21 VITALS — BP 132/70 | HR 81 | Temp 98.3°F | Ht 69.0 in | Wt 157.0 lb

## 2024-06-21 DIAGNOSIS — R0989 Other specified symptoms and signs involving the circulatory and respiratory systems: Secondary | ICD-10-CM

## 2024-06-21 DIAGNOSIS — J309 Allergic rhinitis, unspecified: Secondary | ICD-10-CM

## 2024-06-21 DIAGNOSIS — F331 Major depressive disorder, recurrent, moderate: Secondary | ICD-10-CM | POA: Diagnosis not present

## 2024-06-21 DIAGNOSIS — F411 Generalized anxiety disorder: Secondary | ICD-10-CM | POA: Diagnosis not present

## 2024-06-21 DIAGNOSIS — G4700 Insomnia, unspecified: Secondary | ICD-10-CM

## 2024-06-21 MED ORDER — ZOLPIDEM TARTRATE 10 MG PO TABS: 10.0000 mg | ORAL_TABLET | Freq: Every evening | ORAL | 0 refills | Status: AC | PRN

## 2024-06-21 NOTE — Assessment & Plan Note (Signed)
 May benefit from SSIR/SNRI soon but at present this may be more of an adjustment disorder with the recent health issues of his parents. Short-interval follow up on this.

## 2024-06-21 NOTE — Assessment & Plan Note (Signed)
 May benefit from SSRI/SNRI soon but at present this may be more of an adjustment disorder with the recent health issues of his parents. Short-interval follow up on this.

## 2024-06-21 NOTE — Assessment & Plan Note (Signed)
 His uncle likely was prescribed montelukast. Patient will confirm and can send me a message if he would like for me to prescribe it for him.

## 2024-06-21 NOTE — Patient Instructions (Signed)
 Reviewed common practices to improve sleep hygiene including consistent sleep schedule, cool sleeping temperatures, keeping bedroom as dark as possible, daytime physical activity (especially resistance training), avoiding harmful bedtime habits (e.g. TV, phone use, etc),  avoiding oral intake within 1 hour of bed, avoiding evening caffeine/alcohol consumption.  Consider mindfulness exercises for sleep e.g. Calm app, yoga, etc.

## 2024-06-21 NOTE — Progress Notes (Signed)
 Date:  06/21/2024   Name:  Joshua Owen   DOB:  06/12/1991   MRN:  [id number]   Chief Complaint: Establish Care (Patient is here to establish care, concern today is he does with not sleep well, averaging 1 - 2 hours of sleep a night)  HPI  Johnryan is a very pleasant 33 y.o. male with longstanding anxiety and depression new to the clinic today to establish care. For the last 6 weeks he has been struggling with sleep-onset insomnia, reporting <2h of superficial sleep per night. He works a physically demanding job, meaning he desperately wants to sleep at night after working all day and also needs the sleep to feel rested in the morning. There are no changes to habits, sleep hygiene, work schedule, substances, medications, etc. He has been stressing about his parents health, stating that they both have diabetes and are not doing so well. Melatonin and Unisom have not helped.   Struggles with perennial allergies, takes Claritin. Has tried nasal sprays without much benefit. Says his uncle has prescription allergy medicine which he tried and seemed to help - unsure of the name but could find it and let me know.    Medication list has been reviewed and updated.  Current Meds  Medication Sig   zolpidem (AMBIEN) 10 MG tablet Take 1 tablet (10 mg total) by mouth at bedtime as needed for sleep.     Review of Systems  Patient Active Problem List   Diagnosis Date Noted   Insomnia 06/21/2024   Generalized anxiety disorder 06/21/2024   Moderate episode of recurrent major depressive disorder (HCC) 06/21/2024   Right carotid bruit 06/21/2024   Allergic rhinitis 06/21/2024    No Known Allergies   There is no immunization history on file for this patient.  History reviewed. No pertinent surgical history.  Social History   Tobacco Use   Smoking status: Every Day    Types: Cigarettes   Smokeless tobacco: Never   Tobacco comments:    Started as 2 ppd age 79 but gradually weaned. As of  06/21/24 he smokes <5 cigs per day.   Substance Use Topics   Alcohol use: No    History reviewed. No pertinent family history.      06/21/2024    8:15 AM  GAD 7 : Generalized Anxiety Score  Nervous, Anxious, on Edge 3  Control/stop worrying 3  Worry too much - different things 3  Trouble relaxing 3  Restless 2  Easily annoyed or irritable 2  Afraid - awful might happen 1  Total GAD 7 Score 17  Anxiety Difficulty Somewhat difficult       06/21/2024    8:15 AM  Depression screen PHQ 2/9  Decreased Interest 3  Down, Depressed, Hopeless 1  PHQ - 2 Score 4  Altered sleeping 3  Tired, decreased energy 3  Change in appetite 0  Feeling bad or failure about yourself  0  Trouble concentrating 1  Moving slowly or fidgety/restless 2  Suicidal thoughts 0  PHQ-9 Score 13  Difficult doing work/chores Somewhat difficult    BP Readings from Last 3 Encounters:  06/21/24 132/70  01/05/23 (!) 147/67  06/07/17 129/66    Wt Readings from Last 3 Encounters:  06/21/24 157 lb (71.2 kg)  01/05/23 178 lb (80.7 kg)  06/07/17 165 lb (74.8 kg)    BP 132/70   Pulse 81   Temp 98.3 F (36.8 C) (Oral)   Ht 5' 9 (1.753 m)  Wt 157 lb (71.2 kg)   SpO2 97%   BMI 23.18 kg/m   Physical Exam Vitals and nursing note reviewed.  Constitutional:      Appearance: Normal appearance.  Neck:     Thyroid: No thyroid mass or thyromegaly.     Vascular: Carotid bruit (right) present.  Cardiovascular:     Rate and Rhythm: Normal rate and regular rhythm.     Heart sounds: No murmur heard.    No friction rub. No gallop.  Pulmonary:     Effort: Pulmonary effort is normal.     Breath sounds: Normal breath sounds.  Abdominal:     General: There is no distension.  Musculoskeletal:        General: Normal range of motion.  Skin:    General: Skin is warm and dry.  Neurological:     Mental Status: He is alert and oriented to person, place, and time.     Gait: Gait is intact.  Psychiatric:         Mood and Affect: Mood is anxious. Affect is tearful.     Recent Labs     Component Value Date/Time   NA 140 07/22/2015 1553   NA 139 06/23/2012 1613   K 4.3 07/22/2015 1553   K 3.7 06/23/2012 1613   CL 104 07/22/2015 1553   CL 105 06/23/2012 1613   CO2 30 07/22/2015 1553   CO2 24 06/23/2012 1613   GLUCOSE 105 (H) 07/22/2015 1553   GLUCOSE 85 06/23/2012 1613   BUN 11 07/22/2015 1553   BUN 18 06/23/2012 1613   CREATININE 1.26 (H) 07/22/2015 1553   CREATININE 1.24 06/23/2012 1613   CALCIUM 10.2 07/22/2015 1553   CALCIUM 9.2 06/23/2012 1613   PROT 8.5 (H) 03/27/2012 1929   ALBUMIN 4.8 03/27/2012 1929   AST 21 03/27/2012 1929   ALT 23 03/27/2012 1929   ALKPHOS 91 03/27/2012 1929   BILITOT 0.5 03/27/2012 1929   GFRNONAA >60 07/22/2015 1553   GFRNONAA >60 06/23/2012 1613   GFRAA >60 07/22/2015 1553   GFRAA >60 06/23/2012 1613    Lab Results  Component Value Date   WBC 10.1 07/22/2015   HGB 14.8 07/22/2015   HCT 42.1 07/22/2015   MCV 89.9 07/22/2015   PLT 234 07/22/2015   No results found for: HGBA1C No results found for: CHOL, HDL, LDLCALC, LDLDIRECT, TRIG, CHOLHDL Lab Results  Component Value Date   TSH 0.68 03/27/2012      Assessment and Plan:  Insomnia, unspecified type Assessment & Plan: Begin zolpidem to help with sleep, but will plan to use short-term for about 1-3 months then taper. We reviewed this does not address the underlying issue and he recognizes that.   Reviewed common practices to improve sleep hygiene including consistent sleep schedule, cool sleeping temperatures, keeping bedroom as dark as possible, daytime physical activity (especially resistance training), avoiding harmful bedtime habits (e.g. TV, phone use, etc),  avoiding oral intake within 1 hour of bed, avoiding evening caffeine/alcohol consumption.  Consider mindfulness exercises for sleep e.g. Calm app, yoga, etc.  Orders: -     Zolpidem Tartrate; Take 1 tablet (10 mg  total) by mouth at bedtime as needed for sleep.  Dispense: 30 tablet; Refill: 0  Generalized anxiety disorder Assessment & Plan: May benefit from SSRI/SNRI soon but at present this may be more of an adjustment disorder with the recent health issues of his parents. Short-interval follow up on this.    Moderate episode of recurrent  major depressive disorder [facility]) Assessment & Plan: May benefit from SSIR/SNRI soon but at present this may be more of an adjustment disorder with the recent health issues of his parents. Short-interval follow up on this.    Right carotid bruit Assessment & Plan: Incidental finding today. Asymptomatic. Likely benign. Did not discuss with patient today due to acute anxiety concerns. Plan to discuss at follow up in 2 weeks once he is sleeping better.    Allergic rhinitis, unspecified seasonality, unspecified trigger Assessment & Plan: His uncle likely was prescribed montelukast. Patient will confirm and can send me a message if he would like for me to prescribe it for him.       Return in about 2 weeks (around 07/05/2024) for VIRTUAL f/u sleep.  Will need routine physical with labs at some point in the coming months.   Rolan Hoyle, PA-C, DMSc, Nutritionist [facility] [phone]

## 2024-06-21 NOTE — Assessment & Plan Note (Signed)
 Begin zolpidem to help with sleep, but will plan to use short-term for about 1-3 months then taper. We reviewed this does not address the underlying issue and he recognizes that.   Reviewed common practices to improve sleep hygiene including consistent sleep schedule, cool sleeping temperatures, keeping bedroom as dark as possible, daytime physical activity (especially resistance training), avoiding harmful bedtime habits (e.g. TV, phone use, etc),  avoiding oral intake within 1 hour of bed, avoiding evening caffeine/alcohol consumption.  Consider mindfulness exercises for sleep e.g. Calm app, yoga, etc.

## 2024-06-21 NOTE — Assessment & Plan Note (Signed)
 Incidental finding today. Asymptomatic. Likely benign. Did not discuss with patient today due to acute anxiety concerns. Plan to discuss at follow up in 2 weeks once he is sleeping better.

## 2024-07-05 ENCOUNTER — Encounter: Payer: Self-pay | Admitting: Physician Assistant

## 2024-07-05 ENCOUNTER — Telehealth: Admitting: Physician Assistant

## 2024-07-05 VITALS — Ht 69.0 in

## 2024-07-05 DIAGNOSIS — G4700 Insomnia, unspecified: Secondary | ICD-10-CM

## 2024-07-05 MED ORDER — MIRTAZAPINE 15 MG PO TABS: 15.0000 mg | ORAL_TABLET | Freq: Every day | ORAL | 0 refills | Status: AC

## 2024-07-05 MED ORDER — HYDROXYZINE HCL 50 MG PO TABS: 50.0000 mg | ORAL_TABLET | Freq: Every evening | ORAL | 0 refills | Status: AC

## 2024-07-05 NOTE — Progress Notes (Signed)
 Date:  07/05/2024   Name:  Joshua Owen   DOB:  Apr 16, 1991   MRN:  [id number]   I connected with [name] on 07/05/2024 via MyChart Video and verified that I am speaking with the correct person using appropriate identifiers. The limitations, risks, security and privacy concerns of performing an evaluation and management service by MyChart Video, including the higher likelihood of inaccurate diagnoses and treatments, and the availability of in person appointments were reviewed. The possible need of an additional face-to-face encounter for complete and high quality delivery of care was discussed. The patient was also made aware that there may be a patient responsible charge related to this service. The patient expressed understanding and wishes to proceed.   Provider location is in medical facility [facility]). Patient location is at their workplace People involved in care of the patient during this telehealth encounter were myself, my CMA, and my front office/scheduling team member.   While video call was attempted, connection issues given his remote workplace prohibited this from being feasible.  The visit was continued with secure phone call via [employer].  Chief Complaint: Insomnia (Ambien not working, wants something that will help him sleep /)  HPI Joshua Owen returns virtually for short interval 2-week follow-up on insomnia, for which he was prescribed zolpidem last visit.  Unfortunately he feels like it has not helped at all which was surprising to him.  He would like to try something else.  In the process of changing jobs, insurance will change soon as well.  (PRIOR NOTE 06/21/24 ): Joshua Owen is a very pleasant 33 y.o. male with longstanding anxiety and depression new to the clinic today to establish care. For the last 6 weeks he has been struggling with sleep-onset insomnia, reporting <2h of superficial sleep per night. He works a  physically demanding job, meaning he desperately wants to sleep at night after working all day and also needs the sleep to feel rested in the morning. There are no changes to habits, sleep hygiene, work schedule, substances, medications, etc. He has been stressing about his parents health, stating that they both have diabetes and are not doing so well. Melatonin and Unisom have not helped. Struggles with perennial allergies, takes Claritin. Has tried nasal sprays without much benefit. Says his uncle has prescription allergy medicine which he tried and seemed to help - unsure of the name but could find it and let me know.     Medication list has been reviewed and updated.  Current Meds  Medication Sig   hydrOXYzine (ATARAX) 50 MG tablet Take 1 tablet (50 mg total) by mouth at bedtime.   mirtazapine (REMERON) 15 MG tablet Take 1 tablet (15 mg total) by mouth at bedtime.   zolpidem (AMBIEN) 10 MG tablet Take 1 tablet (10 mg total) by mouth at bedtime as needed for sleep.     Review of Systems  Patient Active Problem List   Diagnosis Date Noted   Insomnia 06/21/2024   Generalized anxiety disorder 06/21/2024   Moderate episode of recurrent major depressive disorder (HCC) 06/21/2024   Right carotid bruit 06/21/2024   Allergic rhinitis 06/21/2024    No Known Allergies   There is no immunization history on file for this patient.  History reviewed. No pertinent surgical history.  Social History   Tobacco Use   Smoking status: Every Day    Types: Cigarettes   Smokeless tobacco: Never   Tobacco comments:  Started as 2 ppd age 60 but gradually weaned. As of 06/21/24 he smokes <5 cigs per day.   Substance Use Topics   Alcohol use: No    History reviewed. No pertinent family history.      07/05/2024    9:32 AM 06/21/2024    8:15 AM  GAD 7 : Generalized Anxiety Score  Nervous, Anxious, on Edge 3 3  Control/stop worrying 3 3  Worry too much - different things 3 3  Trouble  relaxing 0 3  Restless 1 2  Easily annoyed or irritable 0 2  Afraid - awful might happen 1 1  Total GAD 7 Score 11 17  Anxiety Difficulty Somewhat difficult Somewhat difficult       07/05/2024    9:31 AM 06/21/2024    8:15 AM  Depression screen PHQ 2/9  Decreased Interest 2 3  Down, Depressed, Hopeless 1 1  PHQ - 2 Score 3 4  Altered sleeping 3 3  Tired, decreased energy 3 3  Change in appetite 0 0  Feeling bad or failure about yourself  0 0  Trouble concentrating 0 1  Moving slowly or fidgety/restless 0 2  Suicidal thoughts 0 0  PHQ-9 Score 9 13  Difficult doing work/chores Somewhat difficult Somewhat difficult    BP Readings from Last 3 Encounters:  06/21/24 132/70  01/05/23 (!) 147/67  06/07/17 129/66    Wt Readings from Last 3 Encounters:  06/21/24 157 lb (71.2 kg)  01/05/23 178 lb (80.7 kg)  06/07/17 165 lb (74.8 kg)    Ht 5' 9 (1.753 m)   BMI 23.18 kg/m   Physical Exam General: Speaking full sentences, no audible heavy breathing. Sounds alert and appropriately interactive. Well-appearing. Face symmetric. Extraocular movements intact. Pupils equal and round. No nasal flaring or accessory muscle use visualized.  Recent Labs     Component Value Date/Time   NA 140 07/22/2015 1553   NA 139 06/23/2012 1613   K 4.3 07/22/2015 1553   K 3.7 06/23/2012 1613   CL 104 07/22/2015 1553   CL 105 06/23/2012 1613   CO2 30 07/22/2015 1553   CO2 24 06/23/2012 1613   GLUCOSE 105 (H) 07/22/2015 1553   GLUCOSE 85 06/23/2012 1613   BUN 11 07/22/2015 1553   BUN 18 06/23/2012 1613   CREATININE 1.26 (H) 07/22/2015 1553   CREATININE 1.24 06/23/2012 1613   CALCIUM 10.2 07/22/2015 1553   CALCIUM 9.2 06/23/2012 1613   PROT 8.5 (H) 03/27/2012 1929   ALBUMIN 4.8 03/27/2012 1929   AST 21 03/27/2012 1929   ALT 23 03/27/2012 1929   ALKPHOS 91 03/27/2012 1929   BILITOT 0.5 03/27/2012 1929   GFRNONAA >60 07/22/2015 1553   GFRNONAA >60 06/23/2012 1613   GFRAA >60 07/22/2015  1553   GFRAA >60 06/23/2012 1613    Lab Results  Component Value Date   WBC 10.1 07/22/2015   HGB 14.8 07/22/2015   HCT 42.1 07/22/2015   MCV 89.9 07/22/2015   PLT 234 07/22/2015   No results found for: HGBA1C No results found for: CHOL, HDL, LDLCALC, LDLDIRECT, TRIG, CHOLHDL Lab Results  Component Value Date   TSH 0.68 03/27/2012     Assessment and Plan:  Insomnia, unspecified type Assessment & Plan: Stop zolpidem.  Will try mirtazapine 15 mg which may also help with any concurrent depression.  He is to use this for 1 week.   If no improvement at all with mirtazapine, stop mirtazapine and begin hydroxyzine.  Hydroxyzine  may also help with his allergies and anxiety.  If he has partial improvement with mirtazapine, he is to add on hydroxyzine to the regimen.  He has been cautioned on potential for morning sedation, and if this occurs he can try splitting his tablets in half for either the hydroxyzine or the mirtazapine.  Orders: -     Mirtazapine; Take 1 tablet (15 mg total) by mouth at bedtime.  Dispense: 30 tablet; Refill: 0 -     hydrOXYzine HCl; Take 1 tablet (50 mg total) by mouth at bedtime.  Dispense: 30 tablet; Refill: 0     Return in about 3 weeks (around 07/26/2024) for OV f/u insomnia, bruit, labs.  Patient will make this in person appointment via MyChart.  Plan to check labs at that time  I discussed the above assessment and treatment plan with the patient. The patient was provided an opportunity to ask questions and all were answered. The patient agreed with the plan and demonstrated an understanding of the instructions. The patient was advised to call back or seek an in-person evaluation if the symptoms worsen or if the condition fails to improve as anticipated. I provided a total time of 20 minutes inclusive of time utilized for medical chart review, information gathering, care coordination with staff, and documentation completion.  Rolan Hoyle,  PA-C, DMSc, Nutritionist [facility] [phone]

## 2024-07-05 NOTE — Assessment & Plan Note (Signed)
 Stop zolpidem.  Will try mirtazapine 15 mg which may also help with any concurrent depression.  He is to use this for 1 week.   If no improvement at all with mirtazapine, stop mirtazapine and begin hydroxyzine.  Hydroxyzine may also help with his allergies and anxiety.  If he has partial improvement with mirtazapine, he is to add on hydroxyzine to the regimen.  He has been cautioned on potential for morning sedation, and if this occurs he can try splitting his tablets in half for either the hydroxyzine or the mirtazapine.
